# Patient Record
Sex: Female | Born: 1940 | Race: White | Hispanic: Yes | Smoking: Never smoker
Health system: Southern US, Community
[De-identification: ages and names within clinical notes are randomized; demographics above are authoritative.]

## PROBLEM LIST (undated history)

## (undated) DIAGNOSIS — E039 Hypothyroidism, unspecified: Secondary | ICD-10-CM

## (undated) DIAGNOSIS — T7840XA Allergy, unspecified, initial encounter: Secondary | ICD-10-CM

## (undated) DIAGNOSIS — E785 Hyperlipidemia, unspecified: Secondary | ICD-10-CM

## (undated) DIAGNOSIS — E119 Type 2 diabetes mellitus without complications: Secondary | ICD-10-CM

## (undated) DIAGNOSIS — K219 Gastro-esophageal reflux disease without esophagitis: Secondary | ICD-10-CM

## (undated) DIAGNOSIS — I10 Essential (primary) hypertension: Secondary | ICD-10-CM

## (undated) DIAGNOSIS — M810 Age-related osteoporosis without current pathological fracture: Secondary | ICD-10-CM

## (undated) DIAGNOSIS — H409 Unspecified glaucoma: Secondary | ICD-10-CM

## (undated) HISTORY — DX: Hyperlipidemia, unspecified: E78.5

## (undated) HISTORY — DX: Unspecified glaucoma: H40.9

## (undated) HISTORY — PX: CHOLECYSTECTOMY: SHX55

## (undated) HISTORY — DX: Essential (primary) hypertension: I10

## (undated) HISTORY — PX: COLONOSCOPY: SHX174

## (undated) HISTORY — PX: TUBAL LIGATION: SHX77

## (undated) HISTORY — DX: Type 2 diabetes mellitus without complications: E11.9

## (undated) HISTORY — PX: BLADDER SURGERY: SHX569

## (undated) HISTORY — DX: Allergy, unspecified, initial encounter: T78.40XA

## (undated) HISTORY — DX: Hypothyroidism, unspecified: E03.9

## (undated) HISTORY — PX: APPENDECTOMY: SHX54

## (undated) HISTORY — DX: Gastro-esophageal reflux disease without esophagitis: K21.9

## (undated) HISTORY — PX: CATARACT EXTRACTION: SUR2

## (undated) HISTORY — DX: Age-related osteoporosis without current pathological fracture: M81.0

---

## 2019-11-22 ENCOUNTER — Encounter: Payer: Self-pay | Admitting: Family Medicine

## 2019-11-22 ENCOUNTER — Ambulatory Visit: Payer: Medicare HMO | Attending: Family Medicine | Admitting: Family Medicine

## 2019-11-22 ENCOUNTER — Other Ambulatory Visit: Payer: Self-pay

## 2019-11-22 VITALS — BP 133/80 | HR 68 | Temp 98.5°F | Resp 18 | Ht 62.0 in | Wt 146.0 lb

## 2019-11-22 DIAGNOSIS — E119 Type 2 diabetes mellitus without complications: Secondary | ICD-10-CM | POA: Insufficient documentation

## 2019-11-22 DIAGNOSIS — E039 Hypothyroidism, unspecified: Secondary | ICD-10-CM | POA: Diagnosis not present

## 2019-11-22 DIAGNOSIS — K219 Gastro-esophageal reflux disease without esophagitis: Secondary | ICD-10-CM | POA: Diagnosis not present

## 2019-11-22 DIAGNOSIS — E1169 Type 2 diabetes mellitus with other specified complication: Secondary | ICD-10-CM | POA: Diagnosis not present

## 2019-11-22 DIAGNOSIS — Z7689 Persons encountering health services in other specified circumstances: Secondary | ICD-10-CM

## 2019-11-22 DIAGNOSIS — E785 Hyperlipidemia, unspecified: Secondary | ICD-10-CM

## 2019-11-22 DIAGNOSIS — Z79899 Other long term (current) drug therapy: Secondary | ICD-10-CM

## 2019-11-22 LAB — POCT GLYCOSYLATED HEMOGLOBIN (HGB A1C): Hemoglobin A1C: 5.7 % — AB (ref 4.0–5.6)

## 2019-11-22 MED ORDER — METFORMIN HCL 850 MG PO TABS: 850.0000 mg | ORAL_TABLET | Freq: Two times a day (BID) | ORAL | 3 refills | Status: AC

## 2019-11-22 MED ORDER — LEVOTHYROXINE SODIUM 50 MCG PO TABS: 50.0000 ug | ORAL_TABLET | Freq: Every day | ORAL | 1 refills | Status: AC

## 2019-11-22 MED ORDER — FAMOTIDINE 20 MG PO TABS: 20.0000 mg | ORAL_TABLET | Freq: Two times a day (BID) | ORAL | 3 refills | Status: AC

## 2019-11-22 MED ORDER — ATORVASTATIN CALCIUM 10 MG PO TABS: 10.0000 mg | ORAL_TABLET | Freq: Every day | ORAL | 3 refills | Status: DC

## 2019-11-22 NOTE — Progress Notes (Signed)
Subjective:  Patient ID: Kristen Nunez, female    DOB: [birth year]  Age: 78 y.o. MRN: [id number]  CC: New Patient (Initial Visit)   HPI Kristen Nunez, very pleasant 78 year old female, who recently moved to the area from [place], [place] as she has a sister who lives here in [place].  Patient with medical issues including type 2 diabetes, hypothyroidism, hyperlipidemia and acid reflux.  She states that overall she is very healthy.  She monitors her home blood sugars and they are generally in the 80s to low 100s.  She has not had any significant hypoglycemic incidents.  She denies any increased thirst, no urinary frequency and no numbness or tingling in her feet.  She has not had any reflux symptoms or abdominal pain since starting the medication for treatment of GERD.  She denies any muscle aches with the use of atorvastatin for her cholesterol.  She denies any symptoms of hypothyroidism or hyperthyroidism, no constipation, no increased heart rate, no unexplained weight loss or weight gain and no peripheral edema.  Overall she feels very healthy.  Past surgical history is significant for appendectomy.  She has never smoked.  She is currently widowed.  Past Surgical History:  Procedure Laterality Date  . APPENDECTOMY      Family History  Problem Relation Age of Onset  . Diabetes Sister   . Cancer Neg Hx   . Hypertension Neg Hx   . Heart disease Neg Hx     Social History   Tobacco Use  . Smoking status: Never Smoker  . Smokeless tobacco: Never Used  Substance Use Topics  . Alcohol use: Not Currently    ROS Review of Systems  Constitutional: Negative for chills, fatigue and fever.  HENT: Negative for sore throat and trouble swallowing.   Eyes: Negative for photophobia and visual disturbance.  Respiratory: Negative for cough and shortness of breath.   Cardiovascular: Negative for chest pain, palpitations and leg swelling.  Gastrointestinal: Negative for abdominal  pain, blood in stool, constipation, diarrhea and nausea.  Endocrine: Negative for cold intolerance, heat intolerance, polydipsia, polyphagia and polyuria.  Musculoskeletal: Negative for arthralgias, back pain and gait problem.  Skin: Negative for rash and wound.  Allergic/Immunologic: Negative for environmental allergies and food allergies.  Neurological: Negative for dizziness and headaches.  Hematological: Negative for adenopathy. Does not bruise/bleed easily.  Psychiatric/Behavioral: Negative for self-injury and suicidal ideas.    Objective:   Today's Vitals: BP 133/80 (BP Location: Left Arm, Patient Position: Sitting, Cuff Size: Normal)   Pulse 68   Temp 98.5 F (36.9 C) (Oral)   Resp 18   Ht 5\' 2"  (1.575 m)   Wt 146 lb (66.2 kg)   SpO2 97%   BMI 26.70 kg/m   Physical Exam Vitals signs and nursing note reviewed.  Constitutional:      General: She is not in acute distress.    Appearance: Normal appearance.     Comments: Small framed, well-nourished, well-developed elderly female in no acute distress wearing mask as per office COVID-19 protocol.  She does not have any assistive devices with her at today's visit  Neck:     Musculoskeletal: Normal range of motion and neck supple. No muscular tenderness.     Vascular: No carotid bruit.  Cardiovascular:     Rate and Rhythm: Normal rate and regular rhythm.     Pulses:          Dorsalis pedis pulses are 1+ on the right side and 1+  on the left side.       Posterior tibial pulses are 1+ on the right side and 1+ on the left side.  Pulmonary:     Effort: Pulmonary effort is normal.     Breath sounds: Normal breath sounds.  Abdominal:     Palpations: Abdomen is soft.     Tenderness: There is no abdominal tenderness. There is no right CVA tenderness, left CVA tenderness, guarding or rebound.  Musculoskeletal:        General: No tenderness or deformity.     Right lower leg: No edema.     Left lower leg: No edema.     Right foot:  Normal range of motion. No deformity.     Left foot: Normal range of motion. No deformity.  Feet:     Right foot:     Protective Sensation: 10 sites tested. 10 sites sensed.     Skin integrity: Skin integrity normal.     Toenail Condition: Right toenails are normal.     Left foot:     Protective Sensation: 10 sites tested. 10 sites sensed.     Skin integrity: Skin integrity normal.     Toenail Condition: Left toenails are normal.  Lymphadenopathy:     Cervical: No cervical adenopathy.  Skin:    General: Skin is warm and dry.  Neurological:     General: No focal deficit present.     Mental Status: She is alert and oriented to person, place, and time.  Psychiatric:        Mood and Affect: Mood normal.        Behavior: Behavior normal.     Assessment & Plan:   1. Type 2 diabetes mellitus without complication, without long-term current use of insulin ([place]); encounter to establish care Hemoglobin A1c was 5.7 showing very good control of diabetes.  She denies any hypoglycemic episodes.  She denies any issues with tolerating Metformin.  She denies any changes in vision, no increased thirst, no urinary frequency and no numbness in the feet.  Patient with a normal diabetic foot exam.  She will have comprehensive metabolic panel in follow-up of diabetes and long-term use of medications.  She believes that she has had diabetic eye exam within the past 6 months.  She was offered but declined influenza immunization. - HgB A1c - Comprehensive metabolic panel - metFORMIN (GLUCOPHAGE) 850 MG tablet; Take 1 tablet (850 mg total) by mouth 2 (two) times daily with a meal.  Dispense: 180 tablet; Refill: 3  2. Hyperlipidemia associated with type 2 diabetes mellitus ([place]) She reports no issues tolerating atorvastatin which was refilled to today's visit.  She will have comprehensive metabolic panel and lipid panel done at today's visit. - Comprehensive metabolic panel - Lipid panel - atorvastatin  (LIPITOR) 10 MG tablet; Take 1 tablet (10 mg total) by mouth daily. 1/2 tablet daily  Dispense: 45 tablet; Refill: 3  3. Hypothyroidism, unspecified type No symptoms suggestive of hypo or hyperthyroidism.  Will repeat TSH at today's visit and she is aware of the need for compliance with medication on a daily basis. - TSH - levothyroxine (SYNTHROID) 50 MCG tablet; Take 1 tablet (50 mcg total) by mouth daily before breakfast.  Dispense: 90 tablet; Refill: 1  4. Gastroesophageal reflux disease, unspecified whether esophagitis present She reports that her reflux is controlled with the use of medication and refill provided of Pepcid.  Avoid late night eating and avoid spicy/greasy foods or known trigger foods. -  famotidine (PEPCID) 20 MG tablet; Take 1 tablet (20 mg total) by mouth 2 (two) times daily.  Dispense: 180 tablet; Refill: 3  5. Encounter for long-term (current) use of medications Comprehensive metabolic panel in follow-up of the use of statin medication, Metformin for treatment of hyperlipidemia and diabetes - Comprehensive metabolic panel  Outpatient Encounter Medications as of 11/22/2019  Medication Sig  . atorvastatin (LIPITOR) 10 MG tablet Take 1 tablet (10 mg total) by mouth daily. 1/2 tablet daily  . famotidine (PEPCID) 20 MG tablet Take 1 tablet (20 mg total) by mouth 2 (two) times daily.  [place] Kitchen levothyroxine (SYNTHROID) 50 MCG tablet Take 1 tablet (50 mcg total) by mouth daily before breakfast.  . metFORMIN (GLUCOPHAGE) 850 MG tablet Take 1 tablet (850 mg total) by mouth 2 (two) times daily with a meal.  . [DISCONTINUED] atorvastatin (LIPITOR) 10 MG tablet Take 10 mg by mouth daily. 1/2 tablet daily  . [DISCONTINUED] famotidine (PEPCID) 20 MG tablet Take 20 mg by mouth 2 (two) times daily.  . [DISCONTINUED] levothyroxine (SYNTHROID) 50 MCG tablet Take 50 mcg by mouth daily before breakfast.  . [DISCONTINUED] metFORMIN (GLUCOPHAGE) 850 MG tablet Take 850 mg by mouth 2 (two) times  daily with a meal.   No facility-administered encounter medications on file as of 11/22/2019.    An After Visit Summary was printed and given to the patient.   Follow-up: Return in about 6 months (around 05/22/2020) for chronic issues.  And as needed  Antony Blackbird MD

## 2019-11-22 NOTE — Patient Instructions (Signed)
Control de la glucemia, en adultos Blood Glucose Monitoring, Adult Controlar el nivel de azcar en la sangre (glucosa) es una parte importante del tratamiento de su diabetes (diabetes mellitus). El control de la glucemia implica realizar controles regulares segn le indiquen, y Catering manager un registro de los resultados (registro diario) a lo largo del [place]. Controlar la glucemia en forma peridica y llevar un registro diario puede:  Ayudarlos a usted y al mdico a Programmer, applications de control de la diabetes segn sea necesario, lo que incluye medicamentos o insulina.  Ayudarlo a usted a comprender de [employer], la actividad fsica, las enfermedades y los medicamentos inciden en la glucemia.  Permitirle a usted saber cul es su nivel de glucemia en cualquier momento. Averiguar rpidamente si su nivel de glucemia es bajo (hipoglucemia) o alto (hiperglucemia). El [employer] objetivos personalizados de su tratamiento. Los objetivos estarn basados en su edad, en otras enfermedades que tenga y en cmo responde al tratamiento de la diabetes. Generalmente, el objetivo del tratamiento es [employer] siguientes niveles de glucemia:  Antes de las comidas (preprandial): de 80 a 130mg /dl (4,4 a 7,23mmol/l).  Despus de las comidas (posprandial): por debajo de 180mg /dl (75mmol/l).  Nivel de A1c: menos del 7%. Materiales necesarios:  Medidor de glucemia.  Tiras reactivas para el medidor. Cada medidor tiene sus propias tiras reactivas. Debe usar las tiras reactivas que trajo su medidor.  Una aguja para pincharse el dedo (lanceta). No use una lanceta ms de una vez.  Un dispositivo que sujeta la lanceta (dispositivo de puncin).  Un diario o cuaderno de anotaciones para Pensions consultant. Cmo controlar su glucemia  1. Lvese las manos con agua y [place]. 2. Pnchese el costado del dedo (no en la punta) con la lanceta. Use un dedo diferente cada vez. 3. Frote suavemente  el dedo hasta que aparezca una pequea gota de [place]. 4. Siga las instrucciones que vienen con el medidor para Garment/textile technologist tira Comptroller, Midwife la sangre sobre la tira y usar el medidor de glucemia. 5. Registre su resultado y las observaciones que desee. Algunos medidores le permiten tomar sangre para la prueba de otras zonas del cuerpo que no son el dedo (zonas alternativas). Las zonas alternativas ms comunes son las siguientes:  Los Field seismologist.  Los muslos.  La palma de la mano. Si cree que puede tener hipoglucemia, o si tiene antecedentes de no saber cundo le est bajando el nivel de glucemia (hipoglucemia asintomtica), no use zonas alternativas. En su lugar, use los dedos. Es posible que las zonas alternativas no sean tan precisas como los dedos porque el flujo de sangre es ms lento en esas zonas. Esto significa que el resultado que obtiene de estas zonas puede estar retrasado y ser un poco diferente del resultado que obtendra de su dedo. Siga estas indicaciones en su casa: Registro diario de glucemia   Cada vez que controle su nivel de glucemia, anote el resultado. Tambin anote aquellos factores que puedan estar afectando su nivel de glucemia, como la dieta y la actividad fsica Designer, multimedia. Esta informacin puede ayudarlos a usted y a su mdico a: ? Charity fundraiser patrones en su glucemia durante el transcurso del [place]. ? Ajustar su plan de control de la diabetes segn sea necesario.  Averige si su Research officer, trade union sus registros en una computadora. [place] [employer] de los medidores de glucosa guardan un registro de las lecturas realizadas con el medidor. Si usted tiene diabetes tipo  1:  Controle su nivel de glucemia 2 o ms veces al da.  Tambin controle su nivel de glucemia: ? Antes de cada inyeccin de insulina. ? Antes y despus de hacer ejercicio. ? Antes de comer. ? 2 horas despus de una comida. ? Ocasionalmente, entre las 2:00a.m. y las 3:00a.m., como  se lo hayan indicado. ? Antes de Optometrist tareas peligrosas, English as a second language teacher o usar maquinaria pesada. ? A la hora de acostarse.  Es posible que Geophysicist/field seismologist con ms frecuencia sus niveles de glucemia, hasta 6 a 10veces por da, si: ? Usted [place] una bomba de insulina. ? Usted necesita mltiples inyecciones diarias (MDI, por sus siglas en ingls). ? Tiene diabetes que no est bien controlada. ? Usted est enfermo. ? Usted tiene antecedentes de hipoglucemia grave. ? Usted tiene hipoglucemia asintomtica. Si usted tiene diabetes tipo 2:  Si recibe insulina u otros medicamentos para la diabetes, controle su nivel de glucemia 2 o ms veces al Training and development officer.  Si recibe tratamiento intensivo con insulina, debe medirse el nivel de glucemia 4o ms veces al [employer]. Ocasionalmente, es posible que deba controlarlo entre las 2:00a.m. y las 3:00a.m., segn se lo indiquen.  Tambin controle su nivel de glucemia: ? Antes y despus de hacer ejercicio. ? Antes de Optometrist tareas peligrosas, English as a second language teacher o usar maquinaria pesada.  Es posible que deba controlar con ms frecuencia su nivel de glucemia si: ? Necesita ajustar la dosis de sus medicamentos. ? Su diabetes no est bien controlada. ? Est enfermo. Consejos generales  Siempre tenga sus insumos a mano.  Todos los medidores de glucemia incluyen un nmero de telfono "directo", disponible las 24 horas, al que podr llamar si tiene preguntas o [place]. Tambin puede consultar a su mdico.  Despus de usar algunas cajas de tiras reactivas, ajuste (calibre) el medidor de glucemia segn las instrucciones del medidor. Comunquese con un mdico si:  El nivel de glucemia es mayor o igual que 240mg /dl (13,33mmol/dl) durante 2das seguidos.  Ha estado enfermo o ha tenido fiebre durante ms de 2das y no mejora.  Tiene alguno de los siguientes problemas durante ms de 6horas: ? No puede comer ni beber. ? Tiene nuseas o vmitos. ? Tiene  diarrea. Solicite ayuda de inmediato si:  Su nivel de glucemia est por debajo de 54mg /dl (93mmol/l).  Se siente confundido o tiene dificultad para pensar con claridad.  Tiene dificultad para respirar.  Tiene un nivel moderado o alto de cetonas en la [place]. Resumen  Controlar el nivel de azcar en la sangre (glucosa) es una parte importante del tratamiento de su diabetes (diabetes mellitus).  El control de la glucemia implica realizar controles regulares segn le indiquen, y Catering manager un registro de los resultados (registro diario) a lo largo del [place].  El [employer] objetivos personalizados de su tratamiento. Los objetivos estarn basados en su edad, en otras enfermedades que tenga y en cmo responde al tratamiento de la diabetes.  Cada vez que controle su nivel de glucemia, anote el resultado. Tambin anote aquellos factores que puedan estar afectando su nivel de glucemia, como la dieta y la actividad fsica Designer, multimedia. Esta informacin no tiene Marine scientist el consejo del mdico. Asegrese de hacerle al mdico cualquier pregunta que tenga. Document Released: 11/30/2005 Document Revised: 03/11/2018 Document Reviewed: 05/11/2016 Elsevier Patient Education  2020 [place].  Blood Glucose Monitoring, Adult Monitoring your blood sugar (glucose) is an important part of managing your diabetes (diabetes mellitus). Blood glucose monitoring involves  checking your blood glucose as often as directed and keeping a record (log) of your results over time. Checking your blood glucose regularly and keeping a blood glucose log can:  Help you and your health care provider adjust your diabetes management plan as needed, including your medicines or insulin.  Help you understand how food, exercise, illnesses, and medicines affect your blood glucose.  Let you know what your blood glucose is at any time. You can quickly find out if you have low blood glucose (hypoglycemia)  or high blood glucose (hyperglycemia). Your health care provider will set individualized treatment goals for you. Your goals will be based on your age, other medical conditions you have, and how you respond to diabetes treatment. Generally, the goal of treatment is to maintain the following blood glucose levels:  Before meals (preprandial): 80-130 mg/dL (4.4-7.2 mmol/L).  After meals (postprandial): below 180 mg/dL (10 mmol/L).  A1c level: less than 7%. Supplies needed:  Blood glucose meter.  Test strips for your meter. Each meter has its own strips. You must use the strips that came with your meter.  A needle to prick your finger (lancet). Do not use a lancet more than one time.  A device that holds the lancet (lancing device).  A journal or log book to write down your results. How to check your blood glucose  1. Wash your hands with soap and water. 2. Prick the side of your finger (not the tip) with the lancet. Use a different finger each time. 3. Gently rub the finger until a small drop of blood appears. 4. Follow instructions that come with your meter for inserting the test strip, applying blood to the strip, and using your blood glucose meter. 5. Write down your result and any notes. Some meters allow you to use areas of your body other than your finger (alternative sites) to test your blood. The most common alternative sites are:  Forearm.  Thigh.  Palm of the hand. If you think you may have hypoglycemia, or if you have a history of not knowing when your blood glucose is getting low (hypoglycemia unawareness), do not use alternative sites. Use your finger instead. Alternative sites may not be as accurate as the fingers, because blood flow is slower in these areas. This means that the result you get may be delayed, and it may be different from the result that you would get from your finger. Follow these instructions at home: Blood glucose log   Every time you check your  blood glucose, write down your result. Also write down any notes about things that may be affecting your blood glucose, such as your diet and exercise for the day. This information can help you and your health care provider: ? Look for patterns in your blood glucose over time. ? Adjust your diabetes management plan as needed.  Check if your meter allows you to download your records to a computer. Most glucose meters store a record of glucose readings in the meter. If you have type 1 diabetes:  Check your blood glucose 2 or more times a day.  Also check your blood glucose: ? Before every insulin injection. ? Before and after exercise. ? Before meals. ? 2 hours after a meal. ? Occasionally between 2:00 a.m. and 3:00 a.m., as directed. ? Before potentially dangerous tasks, like driving or using heavy machinery. ? At bedtime.  You may need to check your blood glucose more often, up to 6-10 times a day, if you: ?  Use an insulin pump. ? Need multiple daily injections (MDI). ? Have diabetes that is not well-controlled. ? Are ill. ? Have a history of severe hypoglycemia. ? Have hypoglycemia unawareness. If you have type 2 diabetes:  If you take insulin or other diabetes medicines, check your blood glucose 2 or more times a day.  If you are on intensive insulin therapy, check your blood glucose 4 or more times a day. Occasionally, you may also need to check between 2:00 a.m. and 3:00 a.m., as directed.  Also check your blood glucose: ? Before and after exercise. ? Before potentially dangerous tasks, like driving or using heavy machinery.  You may need to check your blood glucose more often if: ? Your medicine is being adjusted. ? Your diabetes is not well-controlled. ? You are ill. General tips  Always keep your supplies with you.  If you have questions or need help, all blood glucose meters have a 24-hour "hotline" phone number that you can call. You may also contact your health  care provider.  After you use a few boxes of test strips, adjust (calibrate) your blood glucose meter by following instructions that came with your meter. Contact a health care provider if:  Your blood glucose is at or above 240 mg/dL (13.3 mmol/L) for 2 days in a row.  You have been sick or have had a fever for 2 days or longer, and you are not getting better.  You have any of the following problems for more than 6 hours: ? You cannot eat or drink. ? You have nausea or vomiting. ? You have diarrhea. Get help right away if:  Your blood glucose is lower than 54 mg/dL (3 mmol/L).  You become confused or you have trouble thinking clearly.  You have difficulty breathing.  You have moderate or large ketone levels in your urine. Summary  Monitoring your blood sugar (glucose) is an important part of managing your diabetes (diabetes mellitus).  Blood glucose monitoring involves checking your blood glucose as often as directed and keeping a record (log) of your results over time.  Your health care provider will set individualized treatment goals for you. Your goals will be based on your age, other medical conditions you have, and how you respond to diabetes treatment.  Every time you check your blood glucose, write down your result. Also write down any notes about things that may be affecting your blood glucose, such as your diet and exercise for the day. This information is not intended to replace advice given to you by your health care provider. Make sure you discuss any questions you have with your health care provider. Document Released: 12/03/2003 Document Revised: 09/23/2018 Document Reviewed: 05/11/2016 Elsevier Patient Education  2020 [employer].

## 2019-11-23 LAB — COMPREHENSIVE METABOLIC PANEL WITH GFR
ALT: 20 IU/L (ref 0–32)
AST: 26 IU/L (ref 0–40)
Albumin/Globulin Ratio: 1.6 (ref 1.2–2.2)
Albumin: 4.6 g/dL (ref 3.7–4.7)
Alkaline Phosphatase: 81 IU/L (ref 39–117)
BUN/Creatinine Ratio: 17 (ref 12–28)
BUN: 14 mg/dL (ref 8–27)
Bilirubin Total: 0.6 mg/dL (ref 0.0–1.2)
CO2: 25 mmol/L (ref 20–29)
Calcium: 9.4 mg/dL (ref 8.7–10.3)
Chloride: 103 mmol/L (ref 96–106)
Creatinine, Ser: 0.82 mg/dL (ref 0.57–1.00)
GFR calc Af Amer: 79 mL/min/1.73
GFR calc non Af Amer: 68 mL/min/1.73
Globulin, Total: 2.8 g/dL (ref 1.5–4.5)
Glucose: 97 mg/dL (ref 65–99)
Potassium: 4.6 mmol/L (ref 3.5–5.2)
Sodium: 143 mmol/L (ref 134–144)
Total Protein: 7.4 g/dL (ref 6.0–8.5)

## 2019-11-23 LAB — TSH: TSH: 2.03 u[IU]/mL (ref 0.450–4.500)

## 2019-11-23 LAB — LIPID PANEL
Chol/HDL Ratio: 2.4 ratio (ref 0.0–4.4)
Cholesterol, Total: 167 mg/dL (ref 100–199)
HDL: 71 mg/dL
LDL Chol Calc (NIH): 79 mg/dL (ref 0–99)
Triglycerides: 94 mg/dL (ref 0–149)
VLDL Cholesterol Cal: 17 mg/dL (ref 5–40)

## 2019-12-25 ENCOUNTER — Telehealth: Payer: Self-pay | Admitting: Family Medicine

## 2019-12-25 DIAGNOSIS — E785 Hyperlipidemia, unspecified: Secondary | ICD-10-CM

## 2019-12-25 DIAGNOSIS — E1169 Type 2 diabetes mellitus with other specified complication: Secondary | ICD-10-CM

## 2019-12-25 MED ORDER — ATORVASTATIN CALCIUM 10 MG PO TABS: 5.0000 mg | ORAL_TABLET | Freq: Every day | ORAL | 1 refills | Status: AC

## 2019-12-25 NOTE — Telephone Encounter (Signed)
Refills sent

## 2019-12-25 NOTE — Telephone Encounter (Signed)
1) Medication(s) Requested (by name): -atorvastatin (LIPITOR) 10 MG tablet   2) Pharmacy of Choice: -[facility], [place]  3) Special Requests: Description is wrong -Take 1 tablet (10 mg total) by mouth daily. 1/2 tablet daily Please correct for further refills

## 2020-01-08 ENCOUNTER — Telehealth: Payer: Self-pay | Admitting: Family Medicine

## 2020-01-08 NOTE — Telephone Encounter (Signed)
Patients pharmacy called and requested for a refill on listed medication.Was informed script was written on 12/25/2019.  Please fu at your earliest convenience.   Atorvastatin

## 2020-01-10 NOTE — Telephone Encounter (Signed)
Rx called to [facility]. 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 Left message on voicemail to inform patient.

## 2020-08-14 DIAGNOSIS — E1169 Type 2 diabetes mellitus with other specified complication: Secondary | ICD-10-CM | POA: Diagnosis not present

## 2020-08-14 DIAGNOSIS — E785 Hyperlipidemia, unspecified: Secondary | ICD-10-CM | POA: Diagnosis not present

## 2020-08-14 DIAGNOSIS — E039 Hypothyroidism, unspecified: Secondary | ICD-10-CM | POA: Diagnosis not present

## 2020-08-14 DIAGNOSIS — M810 Age-related osteoporosis without current pathological fracture: Secondary | ICD-10-CM | POA: Diagnosis not present

## 2020-09-26 DIAGNOSIS — Z1211 Encounter for screening for malignant neoplasm of colon: Secondary | ICD-10-CM | POA: Diagnosis not present

## 2020-09-26 DIAGNOSIS — Z0000 Encounter for general adult medical examination without abnormal findings: Secondary | ICD-10-CM | POA: Diagnosis not present

## 2020-09-26 DIAGNOSIS — M810 Age-related osteoporosis without current pathological fracture: Secondary | ICD-10-CM | POA: Diagnosis not present

## 2020-09-26 DIAGNOSIS — Z23 Encounter for immunization: Secondary | ICD-10-CM | POA: Diagnosis not present

## 2020-09-26 DIAGNOSIS — M1990 Unspecified osteoarthritis, unspecified site: Secondary | ICD-10-CM | POA: Diagnosis not present

## 2020-09-26 DIAGNOSIS — E785 Hyperlipidemia, unspecified: Secondary | ICD-10-CM | POA: Diagnosis not present

## 2020-09-26 DIAGNOSIS — E039 Hypothyroidism, unspecified: Secondary | ICD-10-CM | POA: Diagnosis not present

## 2020-09-26 DIAGNOSIS — E1129 Type 2 diabetes mellitus with other diabetic kidney complication: Secondary | ICD-10-CM | POA: Diagnosis not present

## 2020-09-26 DIAGNOSIS — I700 Atherosclerosis of aorta: Secondary | ICD-10-CM | POA: Diagnosis not present

## 2020-10-04 DIAGNOSIS — Z0100 Encounter for examination of eyes and vision without abnormal findings: Secondary | ICD-10-CM | POA: Diagnosis not present

## 2020-10-04 DIAGNOSIS — H524 Presbyopia: Secondary | ICD-10-CM | POA: Diagnosis not present

## 2020-10-11 DIAGNOSIS — Z961 Presence of intraocular lens: Secondary | ICD-10-CM | POA: Diagnosis not present

## 2020-10-11 DIAGNOSIS — H401131 Primary open-angle glaucoma, bilateral, mild stage: Secondary | ICD-10-CM | POA: Diagnosis not present

## 2020-10-15 ENCOUNTER — Other Ambulatory Visit: Payer: Self-pay | Admitting: Internal Medicine

## 2020-10-15 DIAGNOSIS — M810 Age-related osteoporosis without current pathological fracture: Secondary | ICD-10-CM

## 2020-11-19 ENCOUNTER — Encounter: Payer: Self-pay | Admitting: Internal Medicine

## 2021-01-01 ENCOUNTER — Ambulatory Visit: Payer: Medicare HMO | Admitting: Internal Medicine

## 2021-01-28 DIAGNOSIS — E785 Hyperlipidemia, unspecified: Secondary | ICD-10-CM | POA: Diagnosis not present

## 2021-01-28 DIAGNOSIS — I700 Atherosclerosis of aorta: Secondary | ICD-10-CM | POA: Diagnosis not present

## 2021-01-28 DIAGNOSIS — I129 Hypertensive chronic kidney disease with stage 1 through stage 4 chronic kidney disease, or unspecified chronic kidney disease: Secondary | ICD-10-CM | POA: Diagnosis not present

## 2021-01-28 DIAGNOSIS — E1129 Type 2 diabetes mellitus with other diabetic kidney complication: Secondary | ICD-10-CM | POA: Diagnosis not present

## 2021-01-28 DIAGNOSIS — N182 Chronic kidney disease, stage 2 (mild): Secondary | ICD-10-CM | POA: Diagnosis not present

## 2021-02-17 ENCOUNTER — Ambulatory Visit: Payer: Medicare HMO | Admitting: Internal Medicine

## 2021-02-17 ENCOUNTER — Other Ambulatory Visit: Payer: Self-pay

## 2021-02-17 ENCOUNTER — Encounter: Payer: Self-pay | Admitting: Internal Medicine

## 2021-02-17 VITALS — BP 142/94 | HR 96 | Ht 59.25 in | Wt 151.2 lb

## 2021-02-17 DIAGNOSIS — K5730 Diverticulosis of large intestine without perforation or abscess without bleeding: Secondary | ICD-10-CM | POA: Diagnosis not present

## 2021-02-17 DIAGNOSIS — Z1211 Encounter for screening for malignant neoplasm of colon: Secondary | ICD-10-CM

## 2021-02-17 DIAGNOSIS — K219 Gastro-esophageal reflux disease without esophagitis: Secondary | ICD-10-CM | POA: Diagnosis not present

## 2021-02-17 MED ORDER — SUTAB 1479-225-188 MG PO TABS: 1.0000 | ORAL_TABLET | Freq: Once | ORAL | 0 refills | Status: AC

## 2021-02-17 NOTE — Patient Instructions (Signed)
You have been scheduled for a colonoscopy. Please follow written instructions given to you at your visit today.  Please pick up your prep supplies at the pharmacy within the next 1-3 days. If you use inhalers (even only as needed), please bring them with you on the day of your procedure.   

## 2021-02-17 NOTE — Progress Notes (Signed)
HISTORY OF PRESENT ILLNESS:  Kristen Nunez is a 80 y.o. female, native of [place], who is sent today by her primary care provider regarding follow-up screening colonoscopy.  Patient had her last colonoscopy in [place] 10 years ago.  I do have a copy of that report.  Examination was performed by Dr. Markus Jarvis at [facility] in [place] December 25, 2010.  Examination was incomplete.  Unable to intubate beyond the splenic flexure.  Extensive left-sided diverticulosis and internal hemorrhoids noted.  The colonic preparation was said to be fair.  A follow-up screening exam was recommended in 5 years.  Patient tells me that she is not aware of the examination was complete and the recommendation for follow-up is 5 years, as opposed to 10.  Her GI review of systems is only remarkable for mild intermittent acid reflux without alarm features.  There was a question of constipation, though the patient tells me that she has 1 or 2 bowel movements every day after taking her morning coffee.  She is diabetic and on oral agent.  She remains quite active.  Review of outside blood work from August 14, 2020 shows normal hemoglobin 12.2.  Normal liver tests.  REVIEW OF SYSTEMS:  All non-GI ROS negative unless otherwise stated in HPI.  Past Medical History:  Diagnosis Date  . Diabetes ([place])   . GERD (gastroesophageal reflux disease)   . Glaucoma   . HTN (hypertension)   . Hyperlipemia   . Hypothyroidism   . Osteoporosis     Past Surgical History:  Procedure Laterality Date  . APPENDECTOMY    . BLADDER SURGERY    . CATARACT EXTRACTION    . CESAREAN SECTION    . CHOLECYSTECTOMY    . TUBAL LIGATION      Social History Kristen Nunez  reports that she has never smoked. She has never used smokeless tobacco. She reports previous alcohol use. She reports that she does not use drugs.  family history includes Arthritis in her mother; Breast cancer in her sister; Diabetes in her  sister; Lung cancer in her brother.  No Known Allergies     PHYSICAL EXAMINATION:  Vital signs: BP (!) 142/94 (BP Location: Left Arm, Patient Position: Sitting, Cuff Size: Normal)   Pulse 96   Ht 4' 11.25" (1.505 m) Comment: height measured without shoes  Wt 151 lb 4 oz (68.6 kg)   BMI 30.29 kg/m   Constitutional: generally well-appearing, no acute distress Psychiatric: alert and oriented x3, cooperative Eyes: extraocular movements intact, anicteric, conjunctiva pink Mouth: oral pharynx moist, no lesions Neck: supple no lymphadenopathy Cardiovascular: heart regular rate and rhythm, no murmur Lungs: clear to auscultation bilaterally Abdomen: soft, nontender, nondistended, no obvious ascites, no peritoneal signs, normal bowel sounds, no organomegaly Rectal: Deferred to colonoscopy Extremities: no clubbing, cyanosis, or lower extremity edema bilaterally Skin: no lesions on visible extremities Neuro: No focal deficits.  Cranial nerves intact  ASSESSMENT:  35.  80 year old female in overall good health presents today regarding colon cancer screening.  Last colonoscopy 10 years ago was incomplete.  She is motivated.  She is an appropriate candidate without contraindication. 2.  GERD without alarm features 3.  Diverticulosis 4.  General medical problems.  Stable.  Type 2 diabetes   PLAN:  1.  Schedule colonoscopy.  PEDIATRIC SCOPE.The nature of the procedure, as well as the risks, benefits, and alternatives were carefully and thoroughly reviewed with the patient. Ample time for discussion and questions allowed. The patient understood,  was satisfied, and agreed to proceed. 2.  Hold diabetic medications the day of the examination to avoid unwanted hypoglycemia. 3.  Reflux precautions 4.  Antacids on demand .  Total time of 45 minutes was spent preparing to see the patient, reviewing outside tests and records, obtaining comprehensive history, performing medically appropriate physical  examination, counseling patient regarding her above listed issues, ordering procedure, and documenting clinical information in the health record

## 2021-03-10 ENCOUNTER — Other Ambulatory Visit: Payer: Medicare HMO

## 2021-03-11 ENCOUNTER — Ambulatory Visit
Admission: RE | Admit: 2021-03-11 | Discharge: 2021-03-11 | Disposition: A | Discharge status: Home / Self Care | Payer: Medicare HMO | Source: Ambulatory Visit | Attending: Internal Medicine | Admitting: Internal Medicine

## 2021-03-11 ENCOUNTER — Other Ambulatory Visit: Payer: Self-pay

## 2021-03-11 DIAGNOSIS — Z780 Asymptomatic menopausal state: Secondary | ICD-10-CM | POA: Diagnosis not present

## 2021-03-11 DIAGNOSIS — M810 Age-related osteoporosis without current pathological fracture: Secondary | ICD-10-CM | POA: Diagnosis not present

## 2021-03-11 DIAGNOSIS — M85832 Other specified disorders of bone density and structure, left forearm: Secondary | ICD-10-CM | POA: Diagnosis not present

## 2021-03-13 ENCOUNTER — Encounter: Payer: Medicare HMO | Admitting: Internal Medicine

## 2021-04-28 ENCOUNTER — Telehealth: Payer: Self-pay | Admitting: Internal Medicine

## 2021-04-28 NOTE — Telephone Encounter (Signed)
Hi Dr. Henrene Pastor, pt called looking to reschedule procedure that she had to cancel on 3/31. Pt came to see you in clinic on 02/17/21. Please advise if it is ok to r/s procedure or if pt needs to come for ov again. Thank you.

## 2021-05-12 NOTE — Telephone Encounter (Signed)
She does not need another office appointment. Please schedule colonoscopy at her convenience.  Thank you

## 2021-05-19 ENCOUNTER — Encounter: Payer: Self-pay | Admitting: Internal Medicine

## 2021-05-19 NOTE — Telephone Encounter (Signed)
Left message to call back  

## 2021-05-19 NOTE — Telephone Encounter (Signed)
Patient has been scheduled for 06/06/21

## 2021-05-22 ENCOUNTER — Ambulatory Visit (AMBULATORY_SURGERY_CENTER): Payer: Medicare HMO | Admitting: *Deleted

## 2021-05-22 ENCOUNTER — Other Ambulatory Visit: Payer: Self-pay

## 2021-05-22 VITALS — Ht 59.75 in | Wt 147.0 lb

## 2021-05-22 DIAGNOSIS — Z1211 Encounter for screening for malignant neoplasm of colon: Secondary | ICD-10-CM

## 2021-05-22 MED ORDER — SUTAB 1479-225-188 MG PO TABS: 1.0000 | ORAL_TABLET | Freq: Once | ORAL | 0 refills | Status: AC

## 2021-05-22 MED ORDER — SUPREP BOWEL PREP KIT 17.5-3.13-1.6 GM/177ML PO SOLN: 1.0000 | Freq: Once | ORAL | 0 refills | Status: DC

## 2021-05-22 NOTE — Progress Notes (Signed)
Pt's previsit is done over the phone and all paperwork (prep instructions, blank consent form to just read over) sent to patient.  Pt's name and DOB verified at the beginning of the previsit.  Pt denies any difficulty with ambulating.    No trouble with anesthesia, denies being told they were difficult to intubate, or hx/fam hx of malignant hyperthermia per pt  Pt states her prep is only $9.00- she does not need coupon   No egg or soy allergy  No home oxygen use   No medications for weight loss taken  emmi information given  Pt denies constipation issues  Pt informed that we do not do prior authorizations for prep

## 2021-05-26 ENCOUNTER — Telehealth: Payer: Self-pay | Admitting: Internal Medicine

## 2021-05-26 DIAGNOSIS — Z1211 Encounter for screening for malignant neoplasm of colon: Secondary | ICD-10-CM

## 2021-05-26 MED ORDER — SUTAB 1479-225-188 MG PO TABS: 24.0000 | ORAL_TABLET | ORAL | 0 refills | Status: DC

## 2021-05-26 NOTE — Telephone Encounter (Signed)
Pt called inquiring about prep for procedure. Her pharmacy told her that we cancelled prescription for Suprep so pt is waiting for new prescription. Pt states that she needs pills form "S[facility]ab."

## 2021-05-26 NOTE — Telephone Encounter (Signed)
SUTAB SCRIPT SENT TO [place] WITH [facility] COUPON CODE ATTACHED

## 2021-06-03 ENCOUNTER — Encounter: Payer: Self-pay | Admitting: Internal Medicine

## 2021-06-04 DIAGNOSIS — M810 Age-related osteoporosis without current pathological fracture: Secondary | ICD-10-CM | POA: Diagnosis not present

## 2021-06-04 DIAGNOSIS — E1129 Type 2 diabetes mellitus with other diabetic kidney complication: Secondary | ICD-10-CM | POA: Diagnosis not present

## 2021-06-04 DIAGNOSIS — I129 Hypertensive chronic kidney disease with stage 1 through stage 4 chronic kidney disease, or unspecified chronic kidney disease: Secondary | ICD-10-CM | POA: Diagnosis not present

## 2021-06-06 ENCOUNTER — Ambulatory Visit (AMBULATORY_SURGERY_CENTER): Payer: Medicare HMO | Admitting: Internal Medicine

## 2021-06-06 ENCOUNTER — Encounter: Payer: Self-pay | Admitting: Internal Medicine

## 2021-06-06 ENCOUNTER — Other Ambulatory Visit: Payer: Self-pay

## 2021-06-06 VITALS — BP 124/82 | HR 76 | Temp 98.6°F | Resp 15 | Ht 59.25 in | Wt 147.0 lb

## 2021-06-06 DIAGNOSIS — D125 Benign neoplasm of sigmoid colon: Secondary | ICD-10-CM

## 2021-06-06 DIAGNOSIS — Z1211 Encounter for screening for malignant neoplasm of colon: Secondary | ICD-10-CM

## 2021-06-06 DIAGNOSIS — D122 Benign neoplasm of ascending colon: Secondary | ICD-10-CM | POA: Diagnosis not present

## 2021-06-06 MED ORDER — SODIUM CHLORIDE 0.9 % IV SOLN: 500.0000 mL | Freq: Once | INTRAVENOUS | Status: DC

## 2021-06-06 NOTE — Progress Notes (Signed)
Called to room to assist during endoscopic procedure.  Patient ID and intended procedure confirmed with present staff. Received instructions for my participation in the procedure from the performing physician.  

## 2021-06-06 NOTE — Progress Notes (Signed)
PT taken to PACU. Monitors in place. VSS. Report given to RN. 

## 2021-06-06 NOTE — Progress Notes (Signed)
Vitals-CW  History reviewed. 

## 2021-06-06 NOTE — Patient Instructions (Addendum)
Handouts given for polyps and diverticulosis in espanol.  USTED TUVO UN PROCEDIMIENTO ENDOSCPICO HOY EN EL [facility]:   Lea el informe del procedimiento que se le entreg para cualquier pregunta especfica sobre lo que se Primary school teacher.  Si el informe del examen no responde a sus preguntas, por favor llame a su gastroenterlogo para aclararlo.  Si usted solicit que no se le den [employer] de lo que se Estate manager/land agent en su procedimiento al [employer] va a cuidar, entonces el informe del procedimiento se ha incluido en un sobre sellado para que usted lo revise despus cuando le sea ms conveniente.   LO QUE PUEDE ESPERAR: Algunas sensaciones de hinchazn en el abdomen.  Puede tener ms gases de lo normal.  El caminar puede ayudarle a eliminar el aire que se le puso en el tracto gastrointestinal durante el procedimiento y reducir la hinchazn.  Si le hicieron una endoscopia inferior (como una colonoscopia o una sigmoidoscopia flexible), podra notar manchas de sangre en las heces fecales o en el papel higinico.  Si se someti a una preparacin intestinal para su procedimiento, es posible que no tenga una evacuacin intestinal normal durante [employer].   Tenga en cuenta:  Es posible que note un poco de irritacin y congestin en la nariz o algn drenaje.  Esto es debido al oxgeno [employer] durante su procedimiento.  No hay que preocuparse y esto debe desaparecer ms o Scientist, research (medical).   SNTOMAS PARA REPORTAR INMEDIATAMENTE:  Despus de una endoscopia inferior (colonoscopia o sigmoidoscopia flexible):  Cantidades excesivas de sangre en las heces fecales  Sensibilidad significativa o empeoramiento de los dolores abdominales   Hinchazn aguda del abdomen que antes no tena   Fiebre de 100F o ms   Despus de la endoscopia superior (EGD)  Vmitos de Biochemist, clinical o material como caf molido   Dolor en el pecho o dolor debajo de los omplatos que antes no tena   Dolor o  dificultad persistente para tragar  Falta de aire que antes no tena   Fiebre de 100F o ms  Heces fecales negras y pegajosas   Para asuntos urgentes o de Freight forwarder, puede comunicarse con un gastroenterlogo a cualquier hora llamando al [phone].  DIETA:  Recomendamos una comida pequea al principio, pero luego puede continuar con su dieta normal.  Tome muchos lquidos, Teacher, adult education las bebidas alcohlicas durante 24 horas.    ACTIVIDAD:  Debe planear tomarse las cosas con calma por el resto del da y no debe CONDUCIR ni usar maquinaria pesada Programmer, applications (debido a los medicamentos de sedacin utilizados durante el examen).     SEGUIMIENTO: Nuestro personal llamar al nmero que aparece en su historial al siguiente da hbil de su procedimiento para ver cmo se siente y para responder cualquier pregunta o inquietud que pueda tener con respecto a la informacin que se le dio despus del procedimiento. Si no podemos contactarle, le dejaremos un mensaje.  Sin embargo, si se siente bien y no tiene Paediatric nurse, no es necesario que nos devuelva la llamada.  Asumiremos que ha regresado a sus actividades diarias normales sin incidentes. Si se le tomaron algunas biopsias, le contactaremos por telfono o por carta en las prximas 3 semanas.  Si no ha sabido [employer] biopsias en el transcurso de 3 semanas, por favor llmenos al [phone].   FIRMAS/CONFIDENCIALIDAD: Usted y/o el acompaante que le cuide han firmado documentos que se ingresarn en su  historial mdico electrnico.  Estas firmas atestiguan el hecho de que la informacin anterior

## 2021-06-06 NOTE — Op Note (Signed)
[facility] Patient Name: Kristen Nunez Procedure Date: 06/06/2021 1:57 PM MRN: [id number] Endoscopist: Docia Chuck. Henrene Pastor , MD Age: 80 Referring MD:  Date of Birth: [birth year] Gender: Female Account #: [id number] Procedure:                Colonoscopy with cold snare polypectomy x 2 Indications:              Screening for colorectal malignant neoplasm Medicines:                Monitored Anesthesia Care Procedure:                Pre-Anesthesia Assessment:                           - Prior to the procedure, a History and Physical                            was performed, and patient medications and                            allergies were reviewed. The patient's tolerance of                            previous anesthesia was also reviewed. The risks                            and benefits of the procedure and the sedation                            options and risks were discussed with the patient.                            All questions were answered, and informed consent                            was obtained. Prior Anticoagulants: The patient has                            taken no previous anticoagulant or antiplatelet                            agents. ASA Grade Assessment: II - A patient with                            mild systemic disease. After reviewing the risks                            and benefits, the patient was deemed in                            satisfactory condition to undergo the procedure.                           After obtaining informed consent, the colonoscope  was passed under direct vision. Throughout the                            procedure, the patient's blood pressure, pulse, and                            oxygen saturations were monitored continuously. The                            Olympus PCF-H190DL (#[id number]) Colonoscope was                            introduced through the anus and advanced to the the                             cecum, identified by appendiceal orifice and                            ileocecal valve. The ileocecal valve, appendiceal                            orifice, and rectum were photographed. The quality                            of the bowel preparation was excellent. The                            colonoscopy was performed without difficulty. The                            patient tolerated the procedure well. The bowel                            preparation used was SUPREP via split dose                            instruction. Scope In: 2:20:31 PM Scope Out: 2:42:05 PM Scope Withdrawal Time: 0 hours 16 minutes 11 seconds  Total Procedure Duration: 0 hours 21 minutes 34 seconds  Findings:                 Two polyps were found in the sigmoid colon and                            ascending colon. The polyps were 2 to 3 mm in size.                            These polyps were removed with a cold snare.                            Resection and retrieval were complete.                           Multiple diverticula were found in the sigmoid  colon. Internal hemorrhoids.                           The exam was otherwise without abnormality on                            direct and retroflexion views. Complications:            No immediate complications. Estimated blood loss:                            None. Estimated Blood Loss:     Estimated blood loss: none. Impression:               - Two 2 to 3 mm polyps in the sigmoid colon and in                            the ascending colon, removed with a cold snare.                            Resected and retrieved.                           - Diverticulosis in the sigmoid colon. Internal                            hemorrhoids.                           - The examination was otherwise normal on direct                            and retroflexion views. Recommendation:           - Repeat colonoscopy is not  recommended for                            surveillance.                           - Patient has a contact number available for                            emergencies. The signs and symptoms of potential                            delayed complications were discussed with the                            patient. Return to normal activities tomorrow.                            Written discharge instructions were provided to the                            patient.                           -  Resume previous diet.                           - Continue present medications.                           - Await pathology results. Docia Chuck. Henrene Pastor, MD 06/06/2021 2:50:20 PM This report has been signed electronically.

## 2021-06-10 ENCOUNTER — Telehealth: Payer: Self-pay

## 2021-06-10 NOTE — Telephone Encounter (Signed)
  Follow up Call-  Call back number 06/06/2021  Post procedure Call Back phone  # [phone]  Permission to leave phone message Yes  Some recent data might be hidden     Patient questions:  Do you have a fever, pain , or abdominal swelling? No. Pain Score  0 *  Have you tolerated food without any problems? Yes.    Have you been able to return to your normal activities? Yes.    Do you have any questions about your discharge instructions: Diet   No. Medications  No. Follow up visit  No.  Do you have questions or concerns about your Care? No.  Actions: * If pain score is 4 or above: No action needed, pain <4.

## 2021-06-17 ENCOUNTER — Encounter: Payer: Self-pay | Admitting: Internal Medicine

## 2021-06-17 DIAGNOSIS — R42 Dizziness and giddiness: Secondary | ICD-10-CM | POA: Diagnosis not present

## 2021-06-17 DIAGNOSIS — M810 Age-related osteoporosis without current pathological fracture: Secondary | ICD-10-CM | POA: Diagnosis not present

## 2021-06-17 DIAGNOSIS — Z7983 Long term (current) use of bisphosphonates: Secondary | ICD-10-CM | POA: Diagnosis not present

## 2021-06-17 DIAGNOSIS — E039 Hypothyroidism, unspecified: Secondary | ICD-10-CM | POA: Diagnosis not present

## 2021-06-17 DIAGNOSIS — H409 Unspecified glaucoma: Secondary | ICD-10-CM | POA: Diagnosis not present

## 2021-06-17 DIAGNOSIS — Z7984 Long term (current) use of oral hypoglycemic drugs: Secondary | ICD-10-CM | POA: Diagnosis not present

## 2021-06-17 DIAGNOSIS — I10 Essential (primary) hypertension: Secondary | ICD-10-CM | POA: Diagnosis not present

## 2021-06-17 DIAGNOSIS — E119 Type 2 diabetes mellitus without complications: Secondary | ICD-10-CM | POA: Diagnosis not present

## 2021-06-17 DIAGNOSIS — K219 Gastro-esophageal reflux disease without esophagitis: Secondary | ICD-10-CM | POA: Diagnosis not present

## 2021-06-17 DIAGNOSIS — E785 Hyperlipidemia, unspecified: Secondary | ICD-10-CM | POA: Diagnosis not present

## 2021-07-22 DIAGNOSIS — H5203 Hypermetropia, bilateral: Secondary | ICD-10-CM | POA: Diagnosis not present

## 2021-07-22 DIAGNOSIS — H524 Presbyopia: Secondary | ICD-10-CM | POA: Diagnosis not present

## 2021-07-22 DIAGNOSIS — H5213 Myopia, bilateral: Secondary | ICD-10-CM | POA: Diagnosis not present

## 2021-07-22 DIAGNOSIS — H52209 Unspecified astigmatism, unspecified eye: Secondary | ICD-10-CM | POA: Diagnosis not present

## 2021-08-26 DIAGNOSIS — H401131 Primary open-angle glaucoma, bilateral, mild stage: Secondary | ICD-10-CM | POA: Diagnosis not present

## 2021-08-26 DIAGNOSIS — H35363 Drusen (degenerative) of macula, bilateral: Secondary | ICD-10-CM | POA: Diagnosis not present

## 2021-08-26 DIAGNOSIS — E119 Type 2 diabetes mellitus without complications: Secondary | ICD-10-CM | POA: Diagnosis not present

## 2021-09-30 DIAGNOSIS — I129 Hypertensive chronic kidney disease with stage 1 through stage 4 chronic kidney disease, or unspecified chronic kidney disease: Secondary | ICD-10-CM | POA: Diagnosis not present

## 2021-09-30 DIAGNOSIS — E039 Hypothyroidism, unspecified: Secondary | ICD-10-CM | POA: Diagnosis not present

## 2021-09-30 DIAGNOSIS — E1129 Type 2 diabetes mellitus with other diabetic kidney complication: Secondary | ICD-10-CM | POA: Diagnosis not present

## 2021-09-30 DIAGNOSIS — M810 Age-related osteoporosis without current pathological fracture: Secondary | ICD-10-CM | POA: Diagnosis not present

## 2021-09-30 DIAGNOSIS — E785 Hyperlipidemia, unspecified: Secondary | ICD-10-CM | POA: Diagnosis not present

## 2021-10-07 DIAGNOSIS — E785 Hyperlipidemia, unspecified: Secondary | ICD-10-CM | POA: Diagnosis not present

## 2021-10-07 DIAGNOSIS — Z1339 Encounter for screening examination for other mental health and behavioral disorders: Secondary | ICD-10-CM | POA: Diagnosis not present

## 2021-10-07 DIAGNOSIS — Z23 Encounter for immunization: Secondary | ICD-10-CM | POA: Diagnosis not present

## 2021-10-07 DIAGNOSIS — I129 Hypertensive chronic kidney disease with stage 1 through stage 4 chronic kidney disease, or unspecified chronic kidney disease: Secondary | ICD-10-CM | POA: Diagnosis not present

## 2021-10-07 DIAGNOSIS — Z0000 Encounter for general adult medical examination without abnormal findings: Secondary | ICD-10-CM | POA: Diagnosis not present

## 2021-10-07 DIAGNOSIS — E1129 Type 2 diabetes mellitus with other diabetic kidney complication: Secondary | ICD-10-CM | POA: Diagnosis not present

## 2021-10-07 DIAGNOSIS — Z1331 Encounter for screening for depression: Secondary | ICD-10-CM | POA: Diagnosis not present

## 2021-10-07 DIAGNOSIS — N182 Chronic kidney disease, stage 2 (mild): Secondary | ICD-10-CM | POA: Diagnosis not present

## 2021-10-07 DIAGNOSIS — E039 Hypothyroidism, unspecified: Secondary | ICD-10-CM | POA: Diagnosis not present

## 2021-10-07 DIAGNOSIS — I700 Atherosclerosis of aorta: Secondary | ICD-10-CM | POA: Diagnosis not present

## 2021-10-07 DIAGNOSIS — M810 Age-related osteoporosis without current pathological fracture: Secondary | ICD-10-CM | POA: Diagnosis not present

## 2022-02-03 DIAGNOSIS — I129 Hypertensive chronic kidney disease with stage 1 through stage 4 chronic kidney disease, or unspecified chronic kidney disease: Secondary | ICD-10-CM | POA: Diagnosis not present

## 2022-02-03 DIAGNOSIS — N182 Chronic kidney disease, stage 2 (mild): Secondary | ICD-10-CM | POA: Diagnosis not present

## 2022-02-03 DIAGNOSIS — I700 Atherosclerosis of aorta: Secondary | ICD-10-CM | POA: Diagnosis not present

## 2022-02-03 DIAGNOSIS — E1129 Type 2 diabetes mellitus with other diabetic kidney complication: Secondary | ICD-10-CM | POA: Diagnosis not present

## 2022-06-10 DIAGNOSIS — I700 Atherosclerosis of aorta: Secondary | ICD-10-CM | POA: Diagnosis not present

## 2022-06-10 DIAGNOSIS — M810 Age-related osteoporosis without current pathological fracture: Secondary | ICD-10-CM | POA: Diagnosis not present

## 2022-06-10 DIAGNOSIS — N182 Chronic kidney disease, stage 2 (mild): Secondary | ICD-10-CM | POA: Diagnosis not present

## 2022-06-10 DIAGNOSIS — E1129 Type 2 diabetes mellitus with other diabetic kidney complication: Secondary | ICD-10-CM | POA: Diagnosis not present

## 2022-06-10 DIAGNOSIS — I129 Hypertensive chronic kidney disease with stage 1 through stage 4 chronic kidney disease, or unspecified chronic kidney disease: Secondary | ICD-10-CM | POA: Diagnosis not present

## 2022-06-26 DIAGNOSIS — E119 Type 2 diabetes mellitus without complications: Secondary | ICD-10-CM | POA: Diagnosis not present

## 2022-06-26 DIAGNOSIS — I10 Essential (primary) hypertension: Secondary | ICD-10-CM | POA: Diagnosis not present

## 2022-06-26 DIAGNOSIS — G8929 Other chronic pain: Secondary | ICD-10-CM | POA: Diagnosis not present

## 2022-06-26 DIAGNOSIS — E785 Hyperlipidemia, unspecified: Secondary | ICD-10-CM | POA: Diagnosis not present

## 2022-06-26 DIAGNOSIS — E039 Hypothyroidism, unspecified: Secondary | ICD-10-CM | POA: Diagnosis not present

## 2022-06-26 DIAGNOSIS — K219 Gastro-esophageal reflux disease without esophagitis: Secondary | ICD-10-CM | POA: Diagnosis not present

## 2022-06-26 DIAGNOSIS — M810 Age-related osteoporosis without current pathological fracture: Secondary | ICD-10-CM | POA: Diagnosis not present

## 2022-06-26 DIAGNOSIS — I951 Orthostatic hypotension: Secondary | ICD-10-CM | POA: Diagnosis not present

## 2022-06-26 DIAGNOSIS — H409 Unspecified glaucoma: Secondary | ICD-10-CM | POA: Diagnosis not present

## 2022-06-26 DIAGNOSIS — Z7984 Long term (current) use of oral hypoglycemic drugs: Secondary | ICD-10-CM | POA: Diagnosis not present

## 2022-06-26 DIAGNOSIS — Z8249 Family history of ischemic heart disease and other diseases of the circulatory system: Secondary | ICD-10-CM | POA: Diagnosis not present

## 2022-06-26 DIAGNOSIS — Z7983 Long term (current) use of bisphosphonates: Secondary | ICD-10-CM | POA: Diagnosis not present

## 2022-07-31 DIAGNOSIS — H5203 Hypermetropia, bilateral: Secondary | ICD-10-CM | POA: Diagnosis not present

## 2022-07-31 DIAGNOSIS — H524 Presbyopia: Secondary | ICD-10-CM | POA: Diagnosis not present

## 2022-07-31 DIAGNOSIS — H5211 Myopia, right eye: Secondary | ICD-10-CM | POA: Diagnosis not present

## 2022-07-31 DIAGNOSIS — H52209 Unspecified astigmatism, unspecified eye: Secondary | ICD-10-CM | POA: Diagnosis not present

## 2022-09-08 DIAGNOSIS — H401131 Primary open-angle glaucoma, bilateral, mild stage: Secondary | ICD-10-CM | POA: Diagnosis not present

## 2022-10-05 DIAGNOSIS — E039 Hypothyroidism, unspecified: Secondary | ICD-10-CM | POA: Diagnosis not present

## 2022-10-05 DIAGNOSIS — M810 Age-related osteoporosis without current pathological fracture: Secondary | ICD-10-CM | POA: Diagnosis not present

## 2022-10-05 DIAGNOSIS — E1129 Type 2 diabetes mellitus with other diabetic kidney complication: Secondary | ICD-10-CM | POA: Diagnosis not present

## 2022-10-05 DIAGNOSIS — E785 Hyperlipidemia, unspecified: Secondary | ICD-10-CM | POA: Diagnosis not present

## 2022-10-05 DIAGNOSIS — R7989 Other specified abnormal findings of blood chemistry: Secondary | ICD-10-CM | POA: Diagnosis not present

## 2022-10-05 DIAGNOSIS — I700 Atherosclerosis of aorta: Secondary | ICD-10-CM | POA: Diagnosis not present

## 2022-10-13 DIAGNOSIS — Z0000 Encounter for general adult medical examination without abnormal findings: Secondary | ICD-10-CM | POA: Diagnosis not present

## 2022-10-13 DIAGNOSIS — N182 Chronic kidney disease, stage 2 (mild): Secondary | ICD-10-CM | POA: Diagnosis not present

## 2022-10-13 DIAGNOSIS — Z1331 Encounter for screening for depression: Secondary | ICD-10-CM | POA: Diagnosis not present

## 2022-10-13 DIAGNOSIS — K219 Gastro-esophageal reflux disease without esophagitis: Secondary | ICD-10-CM | POA: Diagnosis not present

## 2022-10-13 DIAGNOSIS — Z1339 Encounter for screening examination for other mental health and behavioral disorders: Secondary | ICD-10-CM | POA: Diagnosis not present

## 2022-10-13 DIAGNOSIS — Z6828 Body mass index (BMI) 28.0-28.9, adult: Secondary | ICD-10-CM | POA: Diagnosis not present

## 2022-10-13 DIAGNOSIS — I700 Atherosclerosis of aorta: Secondary | ICD-10-CM | POA: Diagnosis not present

## 2022-10-13 DIAGNOSIS — E039 Hypothyroidism, unspecified: Secondary | ICD-10-CM | POA: Diagnosis not present

## 2022-10-13 DIAGNOSIS — I129 Hypertensive chronic kidney disease with stage 1 through stage 4 chronic kidney disease, or unspecified chronic kidney disease: Secondary | ICD-10-CM | POA: Diagnosis not present

## 2022-10-13 DIAGNOSIS — Z23 Encounter for immunization: Secondary | ICD-10-CM | POA: Diagnosis not present

## 2022-10-13 DIAGNOSIS — E1129 Type 2 diabetes mellitus with other diabetic kidney complication: Secondary | ICD-10-CM | POA: Diagnosis not present

## 2022-10-13 DIAGNOSIS — M810 Age-related osteoporosis without current pathological fracture: Secondary | ICD-10-CM | POA: Diagnosis not present

## 2022-12-18 DIAGNOSIS — R0789 Other chest pain: Secondary | ICD-10-CM | POA: Diagnosis not present

## 2022-12-18 DIAGNOSIS — I129 Hypertensive chronic kidney disease with stage 1 through stage 4 chronic kidney disease, or unspecified chronic kidney disease: Secondary | ICD-10-CM | POA: Diagnosis not present

## 2023-03-23 DIAGNOSIS — E785 Hyperlipidemia, unspecified: Secondary | ICD-10-CM | POA: Diagnosis not present

## 2023-03-23 DIAGNOSIS — Z8249 Family history of ischemic heart disease and other diseases of the circulatory system: Secondary | ICD-10-CM | POA: Diagnosis not present

## 2023-03-23 DIAGNOSIS — I129 Hypertensive chronic kidney disease with stage 1 through stage 4 chronic kidney disease, or unspecified chronic kidney disease: Secondary | ICD-10-CM | POA: Diagnosis not present

## 2023-03-23 DIAGNOSIS — M1990 Unspecified osteoarthritis, unspecified site: Secondary | ICD-10-CM | POA: Diagnosis not present

## 2023-03-23 DIAGNOSIS — Z7984 Long term (current) use of oral hypoglycemic drugs: Secondary | ICD-10-CM | POA: Diagnosis not present

## 2023-03-23 DIAGNOSIS — K219 Gastro-esophageal reflux disease without esophagitis: Secondary | ICD-10-CM | POA: Diagnosis not present

## 2023-03-23 DIAGNOSIS — E1122 Type 2 diabetes mellitus with diabetic chronic kidney disease: Secondary | ICD-10-CM | POA: Diagnosis not present

## 2023-03-23 DIAGNOSIS — M810 Age-related osteoporosis without current pathological fracture: Secondary | ICD-10-CM | POA: Diagnosis not present

## 2023-03-23 DIAGNOSIS — R42 Dizziness and giddiness: Secondary | ICD-10-CM | POA: Diagnosis not present

## 2023-03-23 DIAGNOSIS — N189 Chronic kidney disease, unspecified: Secondary | ICD-10-CM | POA: Diagnosis not present

## 2023-03-23 DIAGNOSIS — I700 Atherosclerosis of aorta: Secondary | ICD-10-CM | POA: Diagnosis not present

## 2023-03-23 DIAGNOSIS — E039 Hypothyroidism, unspecified: Secondary | ICD-10-CM | POA: Diagnosis not present

## 2023-03-24 DIAGNOSIS — H401121 Primary open-angle glaucoma, left eye, mild stage: Secondary | ICD-10-CM | POA: Diagnosis not present

## 2023-04-01 ENCOUNTER — Other Ambulatory Visit: Payer: Self-pay | Admitting: Internal Medicine

## 2023-04-01 DIAGNOSIS — E785 Hyperlipidemia, unspecified: Secondary | ICD-10-CM | POA: Diagnosis not present

## 2023-04-01 DIAGNOSIS — E1129 Type 2 diabetes mellitus with other diabetic kidney complication: Secondary | ICD-10-CM | POA: Diagnosis not present

## 2023-04-01 DIAGNOSIS — M810 Age-related osteoporosis without current pathological fracture: Secondary | ICD-10-CM | POA: Diagnosis not present

## 2023-04-01 DIAGNOSIS — E1122 Type 2 diabetes mellitus with diabetic chronic kidney disease: Secondary | ICD-10-CM | POA: Diagnosis not present

## 2023-04-01 DIAGNOSIS — I129 Hypertensive chronic kidney disease with stage 1 through stage 4 chronic kidney disease, or unspecified chronic kidney disease: Secondary | ICD-10-CM | POA: Diagnosis not present

## 2023-04-01 DIAGNOSIS — N182 Chronic kidney disease, stage 2 (mild): Secondary | ICD-10-CM | POA: Diagnosis not present

## 2023-04-01 DIAGNOSIS — I700 Atherosclerosis of aorta: Secondary | ICD-10-CM | POA: Diagnosis not present

## 2023-10-12 DIAGNOSIS — M810 Age-related osteoporosis without current pathological fracture: Secondary | ICD-10-CM | POA: Diagnosis not present

## 2023-10-12 DIAGNOSIS — E1129 Type 2 diabetes mellitus with other diabetic kidney complication: Secondary | ICD-10-CM | POA: Diagnosis not present

## 2023-10-12 DIAGNOSIS — E039 Hypothyroidism, unspecified: Secondary | ICD-10-CM | POA: Diagnosis not present

## 2023-10-12 DIAGNOSIS — E785 Hyperlipidemia, unspecified: Secondary | ICD-10-CM | POA: Diagnosis not present

## 2023-10-21 DIAGNOSIS — E1129 Type 2 diabetes mellitus with other diabetic kidney complication: Secondary | ICD-10-CM | POA: Diagnosis not present

## 2023-10-21 DIAGNOSIS — I129 Hypertensive chronic kidney disease with stage 1 through stage 4 chronic kidney disease, or unspecified chronic kidney disease: Secondary | ICD-10-CM | POA: Diagnosis not present

## 2023-10-21 DIAGNOSIS — I700 Atherosclerosis of aorta: Secondary | ICD-10-CM | POA: Diagnosis not present

## 2023-10-21 DIAGNOSIS — N182 Chronic kidney disease, stage 2 (mild): Secondary | ICD-10-CM | POA: Diagnosis not present

## 2023-10-21 DIAGNOSIS — E785 Hyperlipidemia, unspecified: Secondary | ICD-10-CM | POA: Diagnosis not present

## 2023-10-21 DIAGNOSIS — Z0000 Encounter for general adult medical examination without abnormal findings: Secondary | ICD-10-CM | POA: Diagnosis not present

## 2023-10-21 DIAGNOSIS — Z1339 Encounter for screening examination for other mental health and behavioral disorders: Secondary | ICD-10-CM | POA: Diagnosis not present

## 2023-10-21 DIAGNOSIS — Z1331 Encounter for screening for depression: Secondary | ICD-10-CM | POA: Diagnosis not present

## 2023-10-25 DIAGNOSIS — H5213 Myopia, bilateral: Secondary | ICD-10-CM | POA: Diagnosis not present

## 2023-10-25 DIAGNOSIS — H524 Presbyopia: Secondary | ICD-10-CM | POA: Diagnosis not present

## 2023-11-19 DIAGNOSIS — M810 Age-related osteoporosis without current pathological fracture: Secondary | ICD-10-CM | POA: Diagnosis not present

## 2023-11-19 DIAGNOSIS — M19019 Primary osteoarthritis, unspecified shoulder: Secondary | ICD-10-CM | POA: Diagnosis not present

## 2023-11-19 DIAGNOSIS — E1122 Type 2 diabetes mellitus with diabetic chronic kidney disease: Secondary | ICD-10-CM | POA: Diagnosis not present

## 2023-11-19 DIAGNOSIS — E785 Hyperlipidemia, unspecified: Secondary | ICD-10-CM | POA: Diagnosis not present

## 2023-11-19 DIAGNOSIS — I129 Hypertensive chronic kidney disease with stage 1 through stage 4 chronic kidney disease, or unspecified chronic kidney disease: Secondary | ICD-10-CM | POA: Diagnosis not present

## 2023-11-19 DIAGNOSIS — M25511 Pain in right shoulder: Secondary | ICD-10-CM | POA: Diagnosis not present

## 2023-11-22 DIAGNOSIS — E2839 Other primary ovarian failure: Secondary | ICD-10-CM | POA: Diagnosis not present

## 2023-11-22 DIAGNOSIS — R6889 Other general symptoms and signs: Secondary | ICD-10-CM | POA: Diagnosis not present

## 2023-11-22 DIAGNOSIS — N958 Other specified menopausal and perimenopausal disorders: Secondary | ICD-10-CM | POA: Diagnosis not present

## 2023-11-22 DIAGNOSIS — M8588 Other specified disorders of bone density and structure, other site: Secondary | ICD-10-CM | POA: Diagnosis not present

## 2024-04-28 ENCOUNTER — Other Ambulatory Visit (HOSPITAL_COMMUNITY): Payer: Self-pay | Admitting: *Deleted

## 2024-05-01 ENCOUNTER — Ambulatory Visit (HOSPITAL_COMMUNITY)
Admission: RE | Admit: 2024-05-01 | Discharge: 2024-05-01 | Disposition: A | Discharge status: Home / Self Care | Source: Ambulatory Visit | Attending: Internal Medicine | Admitting: Internal Medicine

## 2024-05-01 DIAGNOSIS — M810 Age-related osteoporosis without current pathological fracture: Secondary | ICD-10-CM | POA: Insufficient documentation

## 2024-05-01 MED ORDER — ZOLEDRONIC ACID 5 MG/100ML IV SOLN
5.0000 mg | Freq: Once | INTRAVENOUS | Status: AC
  Administered 2024-05-01: 5 mg via INTRAVENOUS

## 2024-05-01 MED ORDER — ZOLEDRONIC ACID 5 MG/100ML IV SOLN
INTRAVENOUS | Status: AC
  Filled 2024-05-01: qty 100
